# Patient Record
Sex: Female | Born: 1965 | Race: White | Hispanic: No | Smoking: Never smoker
Health system: Southern US, Community
[De-identification: ages and names within clinical notes are randomized; demographics above are authoritative.]

---

## 2008-06-06 ENCOUNTER — Emergency Department (HOSPITAL_BASED_OUTPATIENT_CLINIC_OR_DEPARTMENT_OTHER): Admission: EM | Admit: 2008-06-06 | Discharge: 2008-06-06 | Payer: Self-pay | Admitting: Emergency Medicine

## 2009-10-29 ENCOUNTER — Inpatient Hospital Stay (HOSPITAL_COMMUNITY): Admission: EM | Admit: 2009-10-29 | Discharge: 2009-10-30 | Payer: Self-pay | Admitting: Emergency Medicine

## 2010-12-20 LAB — MRSA PCR SCREENING: MRSA by PCR: NEGATIVE

## 2010-12-20 LAB — CBC
HCT: 39.4 % (ref 36.0–46.0)
MCHC: 34.7 g/dL (ref 30.0–36.0)
MCV: 96.9 fL (ref 78.0–100.0)
Platelets: 261 10*3/uL (ref 150–400)
RDW: 13 % (ref 11.5–15.5)

## 2010-12-20 LAB — DIFFERENTIAL
Basophils Absolute: 0.1 10*3/uL (ref 0.0–0.1)
Eosinophils Absolute: 0 10*3/uL (ref 0.0–0.7)
Lymphs Abs: 1.9 10*3/uL (ref 0.7–4.0)
Monocytes Absolute: 0.6 10*3/uL (ref 0.1–1.0)

## 2010-12-20 LAB — PROTIME-INR
INR: 0.94 (ref 0.00–1.49)
Prothrombin Time: 12.5 seconds (ref 11.6–15.2)

## 2010-12-20 LAB — APTT: aPTT: 22 seconds — ABNORMAL LOW (ref 24–37)
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
## 2016-04-12 ENCOUNTER — Emergency Department (HOSPITAL_BASED_OUTPATIENT_CLINIC_OR_DEPARTMENT_OTHER): Payer: Self-pay

## 2016-04-12 ENCOUNTER — Encounter (HOSPITAL_BASED_OUTPATIENT_CLINIC_OR_DEPARTMENT_OTHER): Payer: Self-pay

## 2016-04-12 ENCOUNTER — Emergency Department (HOSPITAL_BASED_OUTPATIENT_CLINIC_OR_DEPARTMENT_OTHER)
Admission: EM | Admit: 2016-04-12 | Discharge: 2016-04-12 | Disposition: A | Discharge status: Home / Self Care | Payer: Self-pay | Attending: Emergency Medicine | Admitting: Emergency Medicine

## 2016-04-12 DIAGNOSIS — J069 Acute upper respiratory infection, unspecified: Secondary | ICD-10-CM | POA: Insufficient documentation

## 2016-04-12 DIAGNOSIS — J029 Acute pharyngitis, unspecified: Secondary | ICD-10-CM

## 2016-04-12 MED ORDER — BENZONATATE 100 MG PO CAPS
100.0000 mg | ORAL_CAPSULE | Freq: Once | ORAL | Status: AC
  Administered 2016-04-12: 100 mg via ORAL
  Filled 2016-04-12: qty 1

## 2016-04-12 MED ORDER — ACETAMINOPHEN 500 MG PO TABS
1000.0000 mg | ORAL_TABLET | Freq: Once | ORAL | Status: AC
  Administered 2016-04-12: 1000 mg via ORAL
  Filled 2016-04-12: qty 2

## 2016-04-12 MED ORDER — IBUPROFEN 800 MG PO TABS
800.0000 mg | ORAL_TABLET | Freq: Once | ORAL | Status: AC
  Administered 2016-04-12: 800 mg via ORAL
  Filled 2016-04-12: qty 1

## 2016-04-12 NOTE — ED Provider Notes (Signed)
CSN: [id number]     Arrival date & time 04/12/16  2026 History  By signing my name below, I, Rosario AdieWilliam Andrew Hiatt, attest that this documentation has been prepared under the direction and in the presence of Melene Planan Varina Hulon, DO.  Electronically Signed: Rosario AdieWilliam Andrew Hiatt, ED Scribe. 04/12/2016. 8:47 PM.   Chief Complaint  Patient presents with  . Sore Throat   Patient is a 50 y.o. female presenting with pharyngitis. The history is provided by the patient. No language interpreter was used.  Sore Throat This is a new problem. The current episode started more than 1 week ago. The problem occurs constantly. The problem has been gradually improving. Pertinent negatives include no chest pain, no abdominal pain, no headaches and no shortness of breath. The symptoms are aggravated by swallowing. The symptoms are relieved by medications (Ibuprofen). The treatment provided mild relief.   HPI Comments: Joline Saltoelle Paone is a 50 y.o. female who presents to the Emergency Department complaining of gradual onset, constant sore throat onset 1 week ago. She has associated fatigue, rhinorrhea, subjective fever, cough, congestion, and post-nasal drip. She states that she was dx'd with Laryngitis on 04/04/00 and was having similar symptoms, but with no noted prescriptions given at that time. Pt has been taking Advil and Mucinex since the onset of her symptoms, with some noted relief. Her sore throat is worsened with swallowing. Pt reports that she has lost around 6-7lbs since the onset of her symptoms, and she has not been eating as much because of her sore throat. She denies smoking cigarettes or other tobacco usage. No sick contact with similar symptoms. She denies sinus pain, ear pain, or any other symptoms.  History reviewed. No pertinent past medical history. History reviewed. No pertinent past surgical history. No family history on file. Social History  Substance Use Topics  . Smoking status: Never Smoker   .  Smokeless tobacco: None  . Alcohol Use: Yes     Comment: weekly   OB History    No data available     Review of Systems  Constitutional: Positive for fever and fatigue. Negative for chills.  HENT: Positive for congestion, postnasal drip, rhinorrhea and sore throat. Negative for ear pain and sinus pressure.   Eyes: Negative for redness and visual disturbance.  Respiratory: Positive for cough. Negative for shortness of breath and wheezing.   Cardiovascular: Negative for chest pain and palpitations.  Gastrointestinal: Negative for nausea, vomiting and abdominal pain.  Genitourinary: Negative for dysuria and urgency.  Musculoskeletal: Negative for myalgias and arthralgias.  Skin: Negative for pallor and wound.  Neurological: Negative for dizziness and headaches.   Allergies  Review of patient's allergies indicates no known allergies.  Home Medications   Prior to Admission medications   Not on File   BP 115/84 mmHg  Pulse 85  Temp(Src) 99.1 F (37.3 C) (Oral)  Resp 16  Ht 5\' 5"  (1.651 m)  Wt 120 lb (54.432 kg)  BMI 19.97 kg/m2  SpO2 98%  LMP  (LMP Unknown)   Physical Exam  Constitutional: She is oriented to person, place, and time. She appears well-developed and well-nourished. No distress.  HENT:  Head: Normocephalic and atraumatic.  Right Ear: External ear normal.  Left Ear: External ear normal.  Pt has swollen nasal turbinates, posterior nasal drip, erythema to posterior oropharynx, and a small serous effusion to the left TM.   Eyes: EOM are normal. Pupils are equal, round, and reactive to light.  Neck: Normal range of motion.  Neck supple.  Cardiovascular: Normal rate, regular rhythm and normal heart sounds.  Exam reveals no gallop and no friction rub.   No murmur heard. Pulmonary/Chest: Effort normal and breath sounds normal. No respiratory distress. She has no wheezes. She has no rales.  Lungs CTA bilaterally.  Abdominal: Soft. She exhibits no distension.   Musculoskeletal: Normal range of motion. She exhibits no edema or tenderness.  Neurological: She is alert and oriented to person, place, and time.  Skin: Skin is warm and dry. She is not diaphoretic.  Psychiatric: She has a normal mood and affect. Her behavior is normal.  Nursing note and vitals reviewed.  ED Course  Procedures (including critical care time) DIAGNOSTIC STUDIES: Oxygen Saturation is 98% on RA, normal by my interpretation.   COORDINATION OF CARE: 8:47 PM-Discussed next steps with pt including DG chest. Pt verbalized understanding and is agreeable with the plan.   Imaging Review Dg Chest 2 View  04/12/2016  CLINICAL DATA:  Cough and fever for 8 days EXAM: CHEST  2 VIEW COMPARISON:  None. FINDINGS: The heart size and mediastinal contours are within normal limits. There is no focal infiltrate, pulmonary edema, or pleural effusion. There is a question 1.2 cm nodule in the medial right lung base. The visualized skeletal structures are unremarkable. IMPRESSION: No active cardiopulmonary disease. Question 1.2 cm nodule in the medial right lung base, further evaluation with chest CT on outpatient basis is recommended. Electronically Signed   By: Sherian Rein M.D.   On: 04/12/2016 21:51    I have personally reviewed and evaluated these images and lab results as part of my medical decision-making.   MDM   Final diagnoses:  Sore throat  URI (upper respiratory infection)    50 yo F With a chief complaint of a sore throat. This been going on for a week. Patient history consistent with a URI. Chest x-ray is negative for pneumonia. See no signs of strep throat. This appears to be more likely postnasal drip. Treat symptomatically.  I personally performed the services described in this documentation, which was scribed in my presence. The recorded information has been reviewed and is accurate.  11:07 PM:  I have discussed the diagnosis/risks/treatment options with the patient and  believe the pt to be eligible for discharge home to follow-up with PCP. We also discussed returning to the ED immediately if new or worsening sx occur. We discussed the sx which are most concerning (e.g., sudden worsening pain, fever, inability to tolerate by mouth) that necessitate immediate return. Medications administered to the patient during their visit and any new prescriptions provided to the patient are listed below.  Medications given during this visit Medications  acetaminophen (TYLENOL) tablet 1,000 mg (1,000 mg Oral Given 04/12/16 2046)  ibuprofen (ADVIL,MOTRIN) tablet 800 mg (800 mg Oral Given 04/12/16 2046)  benzonatate (TESSALON) capsule 100 mg (100 mg Oral Given 04/12/16 2046)    There are no discharge medications for this patient.   The patient appears reasonably screen and/or stabilized for discharge and I doubt any other medical condition or other [facility] requiring further screening, evaluation, or treatment in the ED at this time prior to discharge.        Melene Plan, DO 04/12/16 2308

## 2016-04-12 NOTE — ED Notes (Signed)
C/o sore throat, laryngitis since 7/3-NAD-steady gait

## 2016-04-12 NOTE — Discharge Instructions (Signed)
Take 4 over the counter ibuprofen tablets 3 times a day or 2 over-the-counter naproxen tablets twice a day for pain. Also take tylenol 1000mg (2 extra strength) four times a day.      Pharyngitis Pharyngitis is redness, pain, and swelling (inflammation) of your pharynx.  CAUSES  Pharyngitis is usually caused by infection. Most of the time, these infections are from viruses (viral) and are part of a cold. However, sometimes pharyngitis is caused by bacteria (bacterial). Pharyngitis can also be caused by allergies. Viral pharyngitis may be spread from person to person by coughing, sneezing, and personal items or utensils (cups, forks, spoons, toothbrushes). Bacterial pharyngitis may be spread from person to person by more intimate contact, such as kissing.  SIGNS AND SYMPTOMS  Symptoms of pharyngitis include:   Sore throat.   Tiredness (fatigue).   Low-grade fever.   Headache.  Joint pain and muscle aches.  Skin rashes.  Swollen lymph nodes.  Plaque-like film on throat or tonsils (often seen with bacterial pharyngitis). DIAGNOSIS  Your health care provider will ask you questions about your illness and your symptoms. Your medical history, along with a physical exam, is often all that is needed to diagnose pharyngitis. Sometimes, a rapid strep test is done. Other lab tests may also be done, depending on the suspected cause.  TREATMENT  Viral pharyngitis will usually get better in 3-4 days without the use of medicine. Bacterial pharyngitis is treated with medicines that kill germs (antibiotics).  HOME CARE INSTRUCTIONS   Drink enough water and fluids to keep your urine clear or pale yellow.   Only take over-the-counter or prescription medicines as directed by your health care provider:   If you are prescribed antibiotics, make sure you finish them even if you start to feel better.   Do not take aspirin.   Get lots of rest.   Gargle with 8 oz of salt water ( tsp of salt  per 1 qt of water) as often as every 1-2 hours to soothe your throat.   Throat lozenges (if you are not at risk for choking) or sprays may be used to soothe your throat. SEEK MEDICAL CARE IF:   You have large, tender lumps in your neck.  You have a rash.  You cough up green, yellow-brown, or bloody spit. SEEK IMMEDIATE MEDICAL CARE IF:   Your neck becomes stiff.  You drool or are unable to swallow liquids.  You vomit or are unable to keep medicines or liquids down.  You have severe pain that does not go away with the use of recommended medicines.  You have trouble breathing (not caused by a stuffy nose). MAKE SURE YOU:   Understand these instructions.  Will watch your condition.  Will get help right away if you are not doing well or get worse.   This information is not intended to replace advice given to you by your health care provider. Make sure you discuss any questions you have with your health care provider.   Document Released: 09/19/2005 Document Revised: 07/10/2013 Document Reviewed: 05/27/2013 Elsevier Interactive Patient Education [employer]2016 [employer]

## 2017-11-20 IMAGING — DX DG CHEST 2V
2 series · 2 of 2 positions shown · non-contrast
Comparison: None.

CLINICAL DATA: Cough and fever for 8 days

EXAM:
CHEST  2 VIEW

[chest pa]
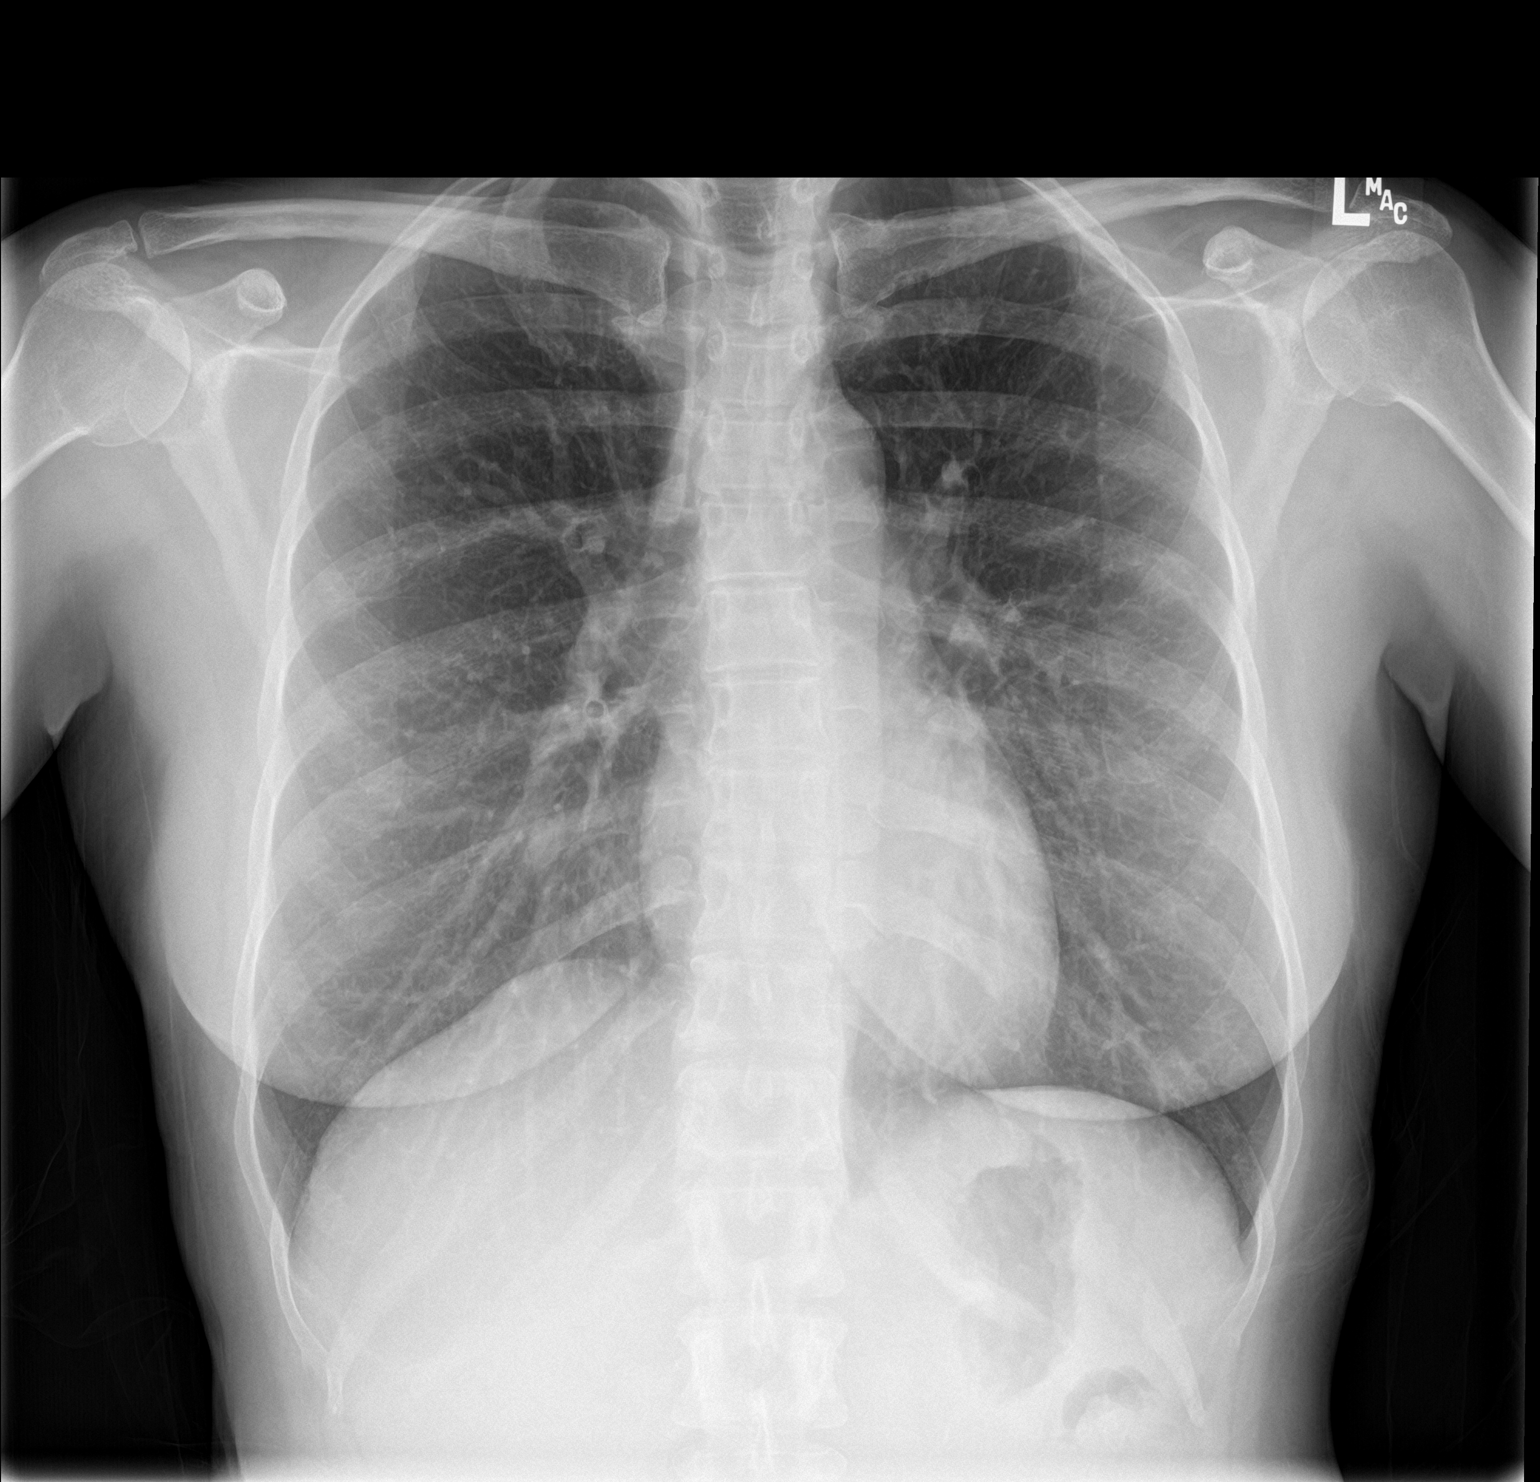

[chest lat]
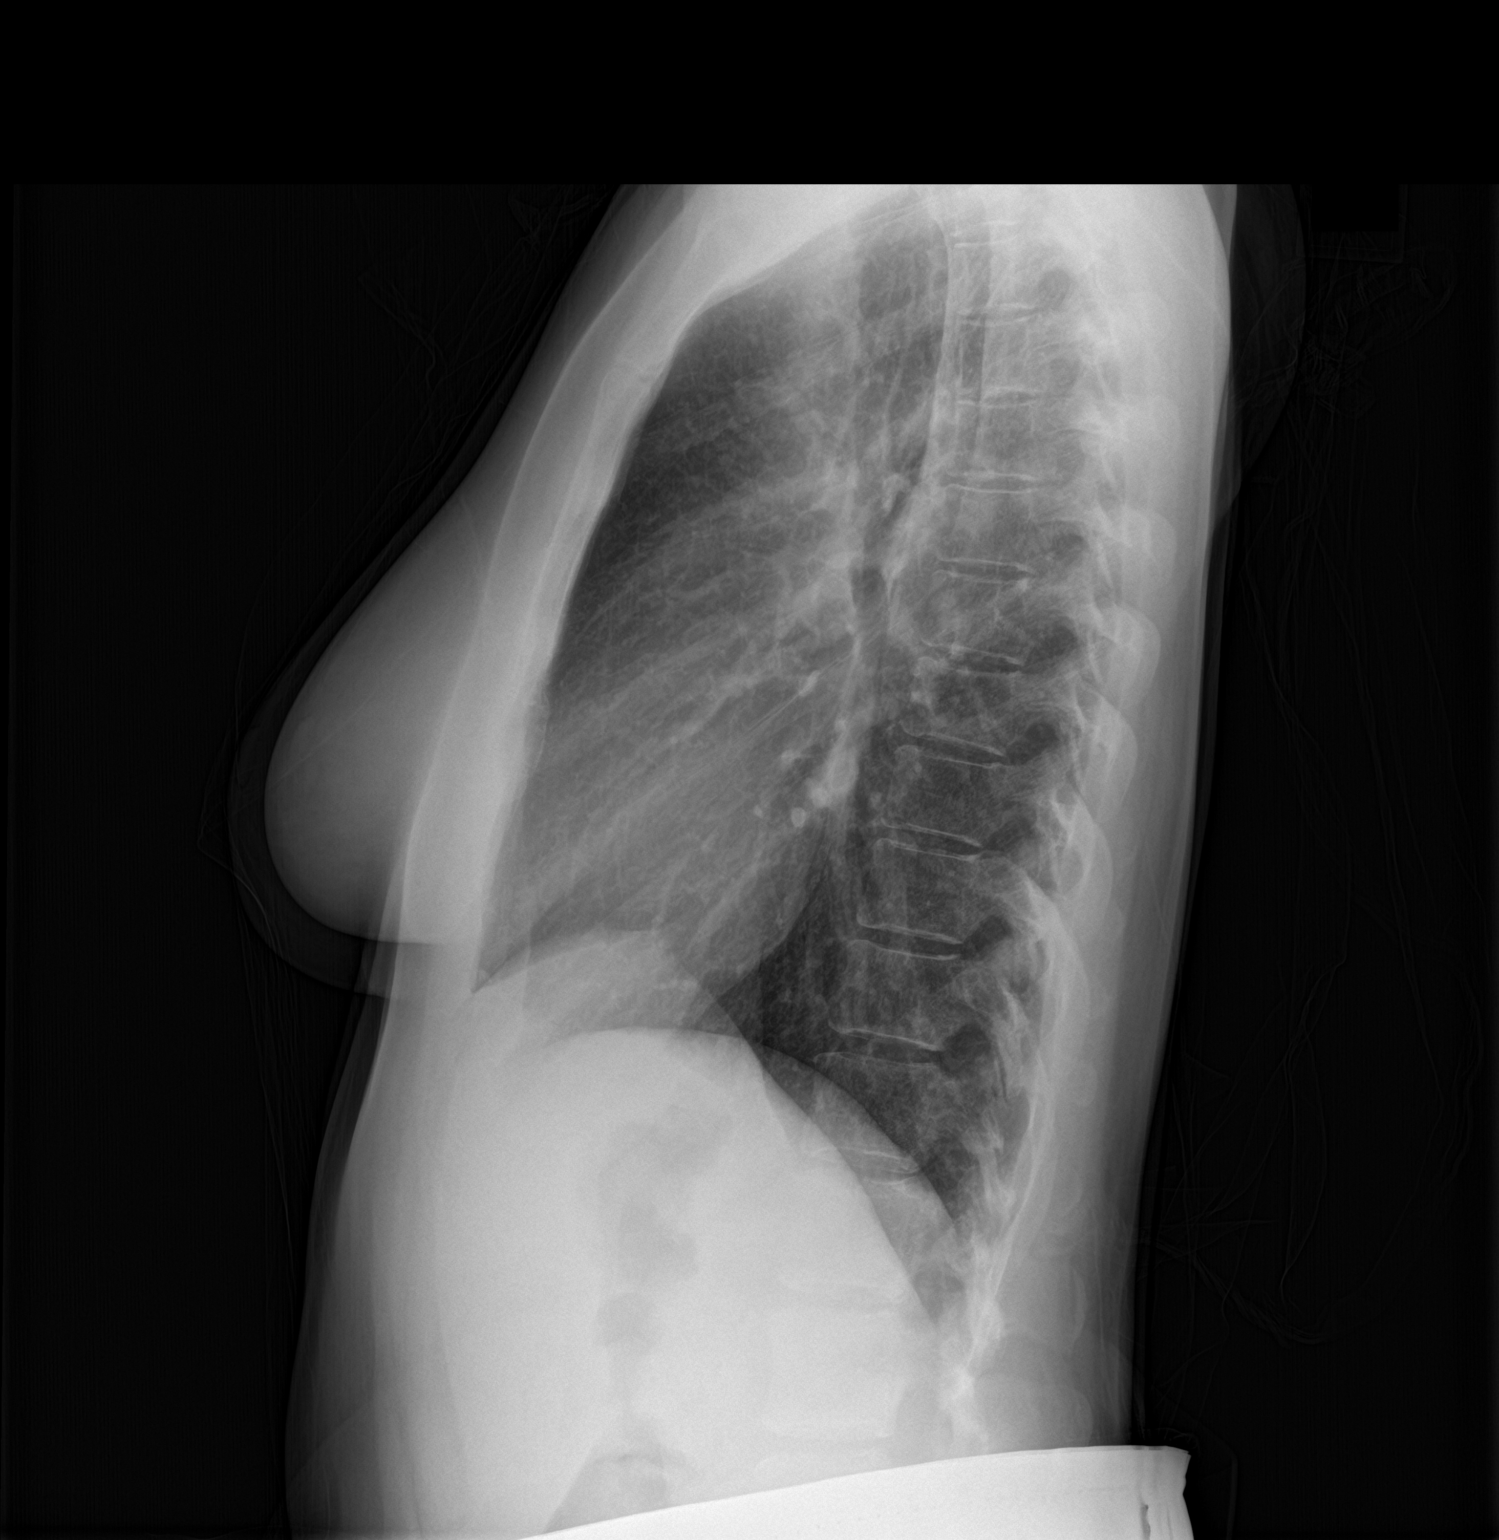

[2 of 2 positions shown; findings below may reference images not displayed]

FINDINGS: The heart size and mediastinal contours are within normal limits.
There is no focal infiltrate, pulmonary edema, or pleural effusion.
There is a question 1.2 cm nodule in the medial right lung base. The
visualized skeletal structures are unremarkable.
IMPRESSION: No active cardiopulmonary disease.

Question 1.2 cm nodule in the medial right lung base, further
evaluation with chest CT on outpatient basis is recommended.

## 2023-10-15 ENCOUNTER — Emergency Department (HOSPITAL_BASED_OUTPATIENT_CLINIC_OR_DEPARTMENT_OTHER): Payer: Self-pay

## 2023-10-15 ENCOUNTER — Other Ambulatory Visit: Payer: Self-pay

## 2023-10-15 ENCOUNTER — Encounter (HOSPITAL_BASED_OUTPATIENT_CLINIC_OR_DEPARTMENT_OTHER): Payer: Self-pay | Admitting: Emergency Medicine

## 2023-10-15 ENCOUNTER — Emergency Department (HOSPITAL_BASED_OUTPATIENT_CLINIC_OR_DEPARTMENT_OTHER)
Admission: EM | Admit: 2023-10-15 | Discharge: 2023-10-16 | Disposition: A | Discharge status: Home / Self Care | Payer: Self-pay | Attending: Student | Admitting: Student

## 2023-10-15 DIAGNOSIS — R509 Fever, unspecified: Secondary | ICD-10-CM

## 2023-10-15 DIAGNOSIS — N12 Tubulo-interstitial nephritis, not specified as acute or chronic: Secondary | ICD-10-CM | POA: Insufficient documentation

## 2023-10-15 DIAGNOSIS — D72829 Elevated white blood cell count, unspecified: Secondary | ICD-10-CM | POA: Insufficient documentation

## 2023-10-15 DIAGNOSIS — Z1152 Encounter for screening for COVID-19: Secondary | ICD-10-CM | POA: Insufficient documentation

## 2023-10-15 LAB — COMPREHENSIVE METABOLIC PANEL
ALT: 49 U/L — ABNORMAL HIGH (ref 0–44)
AST: 29 U/L (ref 15–41)
Albumin: 3.3 g/dL — ABNORMAL LOW (ref 3.5–5.0)
Alkaline Phosphatase: 149 U/L — ABNORMAL HIGH (ref 38–126)
Anion gap: 13 (ref 5–15)
BUN: 13 mg/dL (ref 6–20)
CO2: 18 mmol/L — ABNORMAL LOW (ref 22–32)
Calcium: 9.6 mg/dL (ref 8.9–10.3)
Chloride: 102 mmol/L (ref 98–111)
Creatinine, Ser: 0.72 mg/dL (ref 0.44–1.00)
GFR, Estimated: >60 mL/min (ref >60)
Glucose, Bld: 112 mg/dL — ABNORMAL HIGH (ref 70–99)
Potassium: 3.6 mmol/L (ref 3.5–5.1)
Sodium: 133 mmol/L — ABNORMAL LOW (ref 135–145)
Total Bilirubin: 0.7 mg/dL (ref 0.0–1.2)
Total Protein: 7.9 g/dL (ref 6.5–8.1)

## 2023-10-15 LAB — LIPASE, BLOOD: Lipase: 25 U/L (ref 11–51)

## 2023-10-15 LAB — RESP PANEL BY RT-PCR (RSV, FLU A&B, COVID)  RVPGX2
Influenza A by PCR: NEGATIVE
Influenza B by PCR: NEGATIVE
Resp Syncytial Virus by PCR: NEGATIVE
SARS Coronavirus 2 by RT PCR: NEGATIVE

## 2023-10-15 LAB — CBC WITH DIFFERENTIAL/PLATELET
Abs Immature Granulocytes: 0.06 10*3/uL (ref 0.00–0.07)
Basophils Absolute: 0 10*3/uL (ref 0.0–0.1)
Basophils Relative: 0 %
Eosinophils Absolute: 0 10*3/uL (ref 0.0–0.5)
Eosinophils Relative: 0 %
HCT: 36.5 % (ref 36.0–46.0)
Hemoglobin: 12.4 g/dL (ref 12.0–15.0)
Immature Granulocytes: 0 %
Lymphocytes Relative: 11 %
Lymphs Abs: 1.6 10*3/uL (ref 0.7–4.0)
MCH: 31.2 pg (ref 26.0–34.0)
MCHC: 34 g/dL (ref 30.0–36.0)
MCV: 91.9 fL (ref 80.0–100.0)
Monocytes Absolute: 1.4 10*3/uL — ABNORMAL HIGH (ref 0.1–1.0)
Monocytes Relative: 10 %
Neutro Abs: 11.6 10*3/uL — ABNORMAL HIGH (ref 1.7–7.7)
Neutrophils Relative %: 79 %
Platelets: 325 10*3/uL (ref 150–400)
RBC: 3.97 MIL/uL (ref 3.87–5.11)
RDW: 12.9 % (ref 11.5–15.5)
WBC: 14.7 10*3/uL — ABNORMAL HIGH (ref 4.0–10.5)
nRBC: 0 % (ref 0.0–0.2)

## 2023-10-15 MED ORDER — IOHEXOL 300 MG/ML  SOLN
100.0000 mL | Freq: Once | INTRAMUSCULAR | Status: AC | PRN
  Administered 2023-10-15: 100 mL via INTRAVENOUS

## 2023-10-15 MED ORDER — CEFADROXIL 500 MG PO CAPS: 500.0000 mg | ORAL_CAPSULE | Freq: Two times a day (BID) | ORAL | 0 refills | Status: AC

## 2023-10-15 MED ORDER — LACTATED RINGERS IV BOLUS
1000.0000 mL | Freq: Once | INTRAVENOUS | Status: AC
  Administered 2023-10-15: 1000 mL via INTRAVENOUS

## 2023-10-15 NOTE — ED Notes (Signed)
Pt is aware that a urine sample is still needed.

## 2023-10-15 NOTE — ED Triage Notes (Signed)
 Pt with fever and vomiting since Wed; sts apple cider vinegar helped a little; denies pain at this time

## 2023-10-15 NOTE — ED Provider Notes (Signed)
 [facility] Provider Note  CSN: [id number] Arrival date & time: 10/15/23 1722  Chief Complaint(s) Fever and Emesis  HPI Jasmine Cole is a 58 y.o. female who presents emergency room for evaluation of fever, headache, nausea, vomiting and abdominal pain.  Symptoms present for the last 5 days with persistent fever nausea and vomiting.  Denies associated chest pain, shortness of breath, diarrhea or other systemic symptoms.  Does endorse abnormal color and foul smell of urine.   Past Medical History History reviewed. No pertinent past medical history. There are no active problems to display for this patient.  Home Medication(s) Prior to Admission medications   Not on File                                                                                                                                    Past Surgical History History reviewed. No pertinent surgical history. Family History History reviewed. No pertinent family history.  Social History Social History   Tobacco Use   Smoking status: Never  Substance Use Topics   Alcohol use: Yes    Comment: weekly   Drug use: No   Allergies Patient has no known allergies.  Review of Systems Review of Systems  Constitutional:  Positive for fever.  Gastrointestinal:  Positive for abdominal pain, nausea and vomiting.    Physical Exam Vital Signs  I have reviewed the triage vital signs BP 123/81   Pulse (!) 108   Temp 98.9 F (37.2 C)   Resp 20   Ht 5' 5 (1.651 m)   Wt 69.9 kg   LMP  (LMP Unknown)   SpO2 98%   BMI 25.63 kg/m   Physical Exam Vitals and nursing note reviewed.  Constitutional:      General: She is not in acute distress.    Appearance: She is well-developed.  HENT:     Head: Normocephalic and atraumatic.  Eyes:     Conjunctiva/sclera: Conjunctivae normal.  Cardiovascular:     Rate and Rhythm: Normal rate and regular rhythm.     Heart sounds: No  murmur heard. Pulmonary:     Effort: Pulmonary effort is normal. No respiratory distress.     Breath sounds: Normal breath sounds.  Abdominal:     Palpations: Abdomen is soft.     Tenderness: There is no abdominal tenderness. There is right CVA tenderness.  Musculoskeletal:        General: No swelling.     Cervical back: Neck supple.  Skin:    General: Skin is warm and dry.     Capillary Refill: Capillary refill takes less than 2 seconds.  Neurological:     Mental Status: She is alert.  Psychiatric:        Mood and Affect: Mood normal.     ED Results and Treatments Labs (all labs ordered are listed, but only abnormal results are displayed)  Labs Reviewed  COMPREHENSIVE METABOLIC PANEL - Abnormal; Notable for the following components:      Result Value   Sodium 133 (*)    CO2 18 (*)    Glucose, Bld 112 (*)    Albumin 3.3 (*)    ALT 49 (*)    Alkaline Phosphatase 149 (*)    All other components within normal limits  CBC WITH DIFFERENTIAL/PLATELET - Abnormal; Notable for the following components:   WBC 14.7 (*)    Neutro Abs 11.6 (*)    Monocytes Absolute 1.4 (*)    All other components within normal limits  RESP PANEL BY RT-PCR (RSV, FLU A&B, COVID)  RVPGX2  LIPASE, BLOOD  URINALYSIS, ROUTINE W REFLEX MICROSCOPIC                                                                                                                          Radiology CT ABDOMEN PELVIS W CONTRAST Result Date: 10/15/2023 CLINICAL DATA:  Left lower quadrant abdominal pain, fever, vomiting EXAM: CT ABDOMEN AND PELVIS WITH CONTRAST TECHNIQUE: Multidetector CT imaging of the abdomen and pelvis was performed using the standard protocol following bolus administration of intravenous contrast. RADIATION DOSE REDUCTION: This exam was performed according to the departmental dose-optimization program which includes automated exposure control, adjustment of the mA and/or kV according to patient size and/or use of  iterative reconstruction technique. CONTRAST:  OMNIPAQUE  IOHEXOL  300 MG/ML  SOLN COMPARISON:  None Available. FINDINGS: Lower chest: Lung bases are clear. Hepatobiliary: Liver is within normal limits. Gallbladder is unremarkable. No intrahepatic or extrahepatic duct dilatation. Pancreas: Within normal limits. Spleen: Within normal limits. Adrenals/Urinary Tract: Adrenal glands are within normal limits. Heterogeneous enhancement of the right kidney, suggesting pyelonephritis. 17 mm right lower pole simple cyst (series 301/image 40), benign (Bosniak I). No follow-up is recommended. Left kidney is within normal limits.  No hydronephrosis. Bladder is within normal limits on CT. Stomach/Bowel: Stomach is within normal limits. No evidence of bowel obstruction. Appendix is not discretely visualized, likely surgically absent. No colonic wall thickening or inflammatory changes. Vascular/Lymphatic: No evidence of abdominal aortic aneurysm. No suspicious abdominopelvic lymphadenopathy. Reproductive: Uterus is within normal limits. Bilateral ovaries are within normal limits. Other: No abdominopelvic ascites. Musculoskeletal: Very mild degenerative changes at L5-S1. IMPRESSION: Suspected right pyelonephritis. Electronically Signed   By: Pinkie Pebbles M.D.   On: 10/15/2023 22:35    Pertinent labs & imaging results that were available during my care of the patient were reviewed by me and considered in my medical decision making (see MDM for details).  Medications Ordered in ED Medications  lactated ringers  bolus 1,000 mL (has no administration in time range)  iohexol  (OMNIPAQUE ) 300 MG/ML solution 100 mL (100 mLs Intravenous Contrast Given 10/15/23 2229)  Procedures Procedures  (including critical care time)  Medical Decision Making / ED Course   This patient presents to the  ED for concern of fever, vomiting, flank pain, this involves an extensive number of treatment options, and is a complaint that carries with it a high risk of complications and morbidity.  The differential diagnosis includes nephrolithiasis, pyelonephritis, obstruction, AAA, musculoskeletal strain, vertebral fracture, intra-abdominal abscess, diverticulitis  MDM: Patient seen emergency room for evaluation of fever, vomiting and flank pain.  Physical exam with CVA tenderness but is otherwise unremarkable.  Laboratory evaluation with leukocytosis to 14.7, CO2 18, ALT 49 but is otherwise unremarkable.  COVID, flu, RSV negative and obtained in setting of fever and vomiting.  CT imaging concerning for pyelonephritis.  At time of signout, patient pending confirmation urinalysis.  Will be discharged with antibiotics if urinalysis is infected.  Please see provider signout note for continuation of workup.  Anticipate discharge   Additional history obtained:  -External records from outside source obtained and reviewed including: Chart review including previous notes, labs, imaging, consultation notes   Lab Tests: -I ordered, reviewed, and interpreted labs.   The pertinent results include:   Labs Reviewed  COMPREHENSIVE METABOLIC PANEL - Abnormal; Notable for the following components:      Result Value   Sodium 133 (*)    CO2 18 (*)    Glucose, Bld 112 (*)    Albumin 3.3 (*)    ALT 49 (*)    Alkaline Phosphatase 149 (*)    All other components within normal limits  CBC WITH DIFFERENTIAL/PLATELET - Abnormal; Notable for the following components:   WBC 14.7 (*)    Neutro Abs 11.6 (*)    Monocytes Absolute 1.4 (*)    All other components within normal limits  RESP PANEL BY RT-PCR (RSV, FLU A&B, COVID)  RVPGX2  LIPASE, BLOOD  URINALYSIS, ROUTINE W REFLEX MICROSCOPIC       Imaging Studies ordered: I ordered imaging studies including CTAP I independently visualized and interpreted imaging. I  agree with the radiologist interpretation   Medicines ordered and prescription drug management: Meds ordered this encounter  Medications   iohexol  (OMNIPAQUE ) 300 MG/ML solution 100 mL   lactated ringers  bolus 1,000 mL    -I have reviewed the patients home medicines and have made adjustments as needed  Critical interventions none    Cardiac Monitoring: The patient was maintained on a cardiac monitor.  I personally viewed and interpreted the cardiac monitored which showed an underlying rhythm of: NSR  Social Determinants of Health:  Factors impacting patients care include: none   Reevaluation: After the interventions noted above, I reevaluated the patient and found that they have :improved  Co morbidities that complicate the patient evaluation History reviewed. No pertinent past medical history.    Dispostion: I considered admission for this patient, and disposition pending confirmatory urinalysis.  Anticipate discharge.     Final Clinical Impression(s) / ED Diagnoses Final diagnoses:  None     @PCDICTATION @    Albertina Dixon, MD 10/16/23 1352

## 2023-10-16 LAB — URINALYSIS, ROUTINE W REFLEX MICROSCOPIC
Bilirubin Urine: NEGATIVE
Glucose, UA: NEGATIVE mg/dL
Ketones, ur: 40 mg/dL — AB
Nitrite: POSITIVE — AB
Protein, ur: 100 mg/dL — AB
Specific Gravity, Urine: 1.01 (ref 1.005–1.030)
pH: 6 (ref 5.0–8.0)

## 2023-10-16 LAB — URINALYSIS, MICROSCOPIC (REFLEX)

## 2023-10-18 LAB — URINE CULTURE: Culture: >=100000 — AB
